# Patient Record
Sex: Female | Born: 1935 | Race: White | Hispanic: No
Health system: Southern US, Community
[De-identification: ages and names within clinical notes are randomized; demographics above are authoritative.]

---

## 2013-01-26 ENCOUNTER — Other Ambulatory Visit: Payer: Self-pay | Admitting: Emergency Medicine

## 2013-08-25 ENCOUNTER — Other Ambulatory Visit: Payer: Self-pay

## 2013-09-04 DIAGNOSIS — F329 Major depressive disorder, single episode, unspecified: Secondary | ICD-10-CM

## 2013-09-04 DIAGNOSIS — F0150 Vascular dementia without behavioral disturbance: Secondary | ICD-10-CM

## 2013-09-04 DIAGNOSIS — E119 Type 2 diabetes mellitus without complications: Secondary | ICD-10-CM

## 2013-09-04 DIAGNOSIS — F3289 Other specified depressive episodes: Secondary | ICD-10-CM

## 2013-09-04 DIAGNOSIS — J189 Pneumonia, unspecified organism: Secondary | ICD-10-CM

## 2013-09-11 ENCOUNTER — Other Ambulatory Visit: Payer: Self-pay

## 2013-09-11 ENCOUNTER — Telehealth: Payer: Self-pay | Admitting: Family Medicine

## 2013-09-11 ENCOUNTER — Other Ambulatory Visit: Payer: Self-pay | Admitting: Emergency Medicine

## 2013-09-28 NOTE — Telephone Encounter (Signed)
[facility] Triage Call Report Triage Record Num: [id number] Operator: Dustin FlockBobbie Jo Dobson-Trail Patient Name: Geanie CooleyDoris Hagin Call Date & Time: Oct 24, 2013 4:27:13AM Patient Phone: [phone] PCP: Tillman Abideichard Letvak Patient Gender: Female PCP Fax : [phone] Patient DOB: 08/30/1912 [facility] [place] Reason for Call: Caller: Elizabeth/RN; PCP: Tillman AbideLetvak , Richard (Family Practice); CB#: [phone]; Call regarding Lungs have filled with fluid; Onset 08-28-14. Labored breating, 86% 02 sat 2L, 110/60, P 53, 99.8 oral. BS not checked this morning. BS 335. Skin pale and moist to touch. States pt will look at you but will thrash in bed. Emergent S&S identified per Diabetes Protocol: Respiratory Problems: new or worsening S&S that may indicate shock. Advised 911. [facility]. Protocol(s) Used: Diabetes: Respiratory Problems Recommended Outcome per Protocol: Activate EMS 911 Reason for Outcome: New or worsening signs and symptoms that may indicate shock Care Advice: ~ 0Feb 27, 2015 4:40:57AM Page 1 of 1 [email or web address]

## 2013-09-28 NOTE — Telephone Encounter (Signed)
[facility] Will follow progress there

## 2013-09-28 DEATH — deceased

## 2013-12-31 ENCOUNTER — Ambulatory Visit: Payer: Self-pay | Admitting: Internal Medicine

## 2014-01-17 IMAGING — CT CT CERVICAL SPINE WITHOUT CONTRAST
1 series · 12 of 14 positions shown, 15 images · non-contrast
Comparison: none

REASON FOR EXAM: fell out of wheel chair
COMMENTS:

[Series 6: axial · axial · 0.30mm/px · z∈[-192,-75]mm · 12 of 74 slices shown, 15 images]
[im 6/74  soft-tissue]
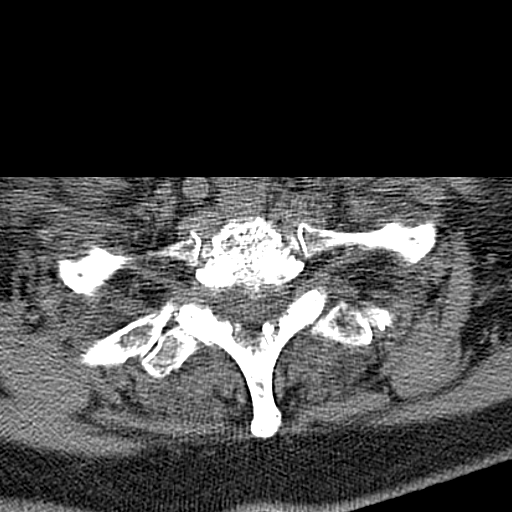
[im 6/74  bone]
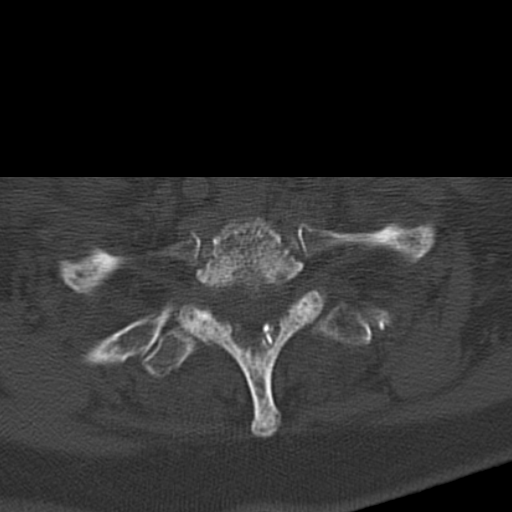
[im 12/74  bone]
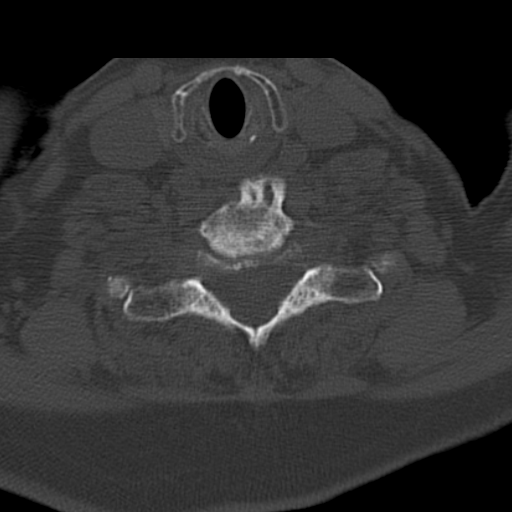
[im 17/74  bone]
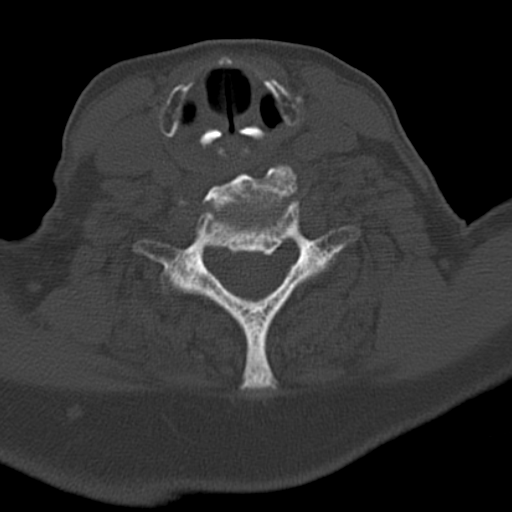
[im 23/74  bone]
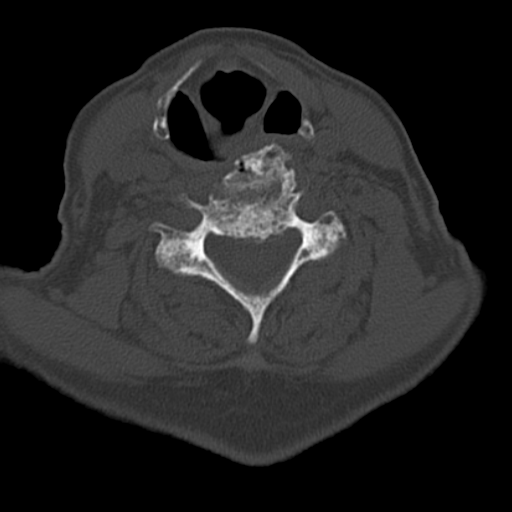
[im 29/74  soft-tissue]
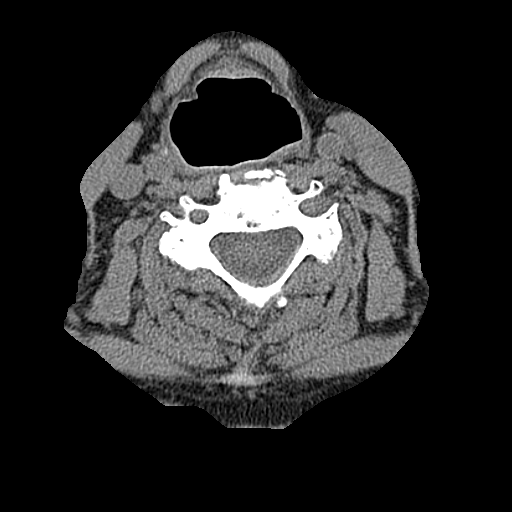
[im 29/74  bone]
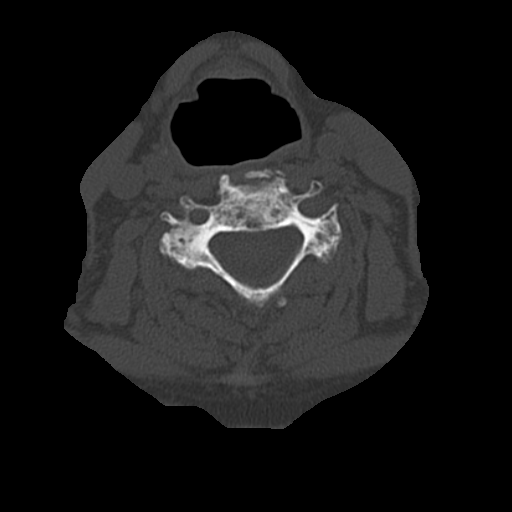
[im 34/74  bone]
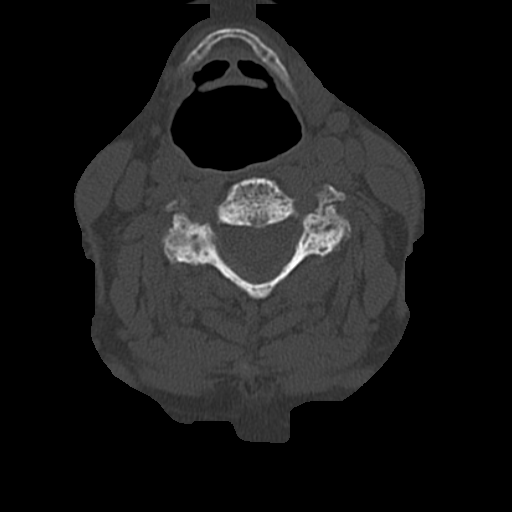
[im 40/74  bone]
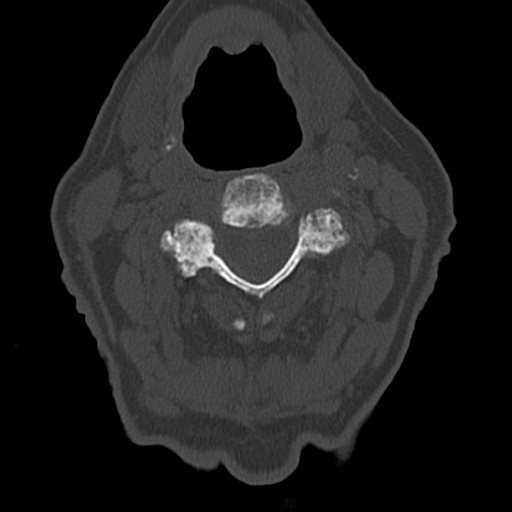
[im 45/74  bone]
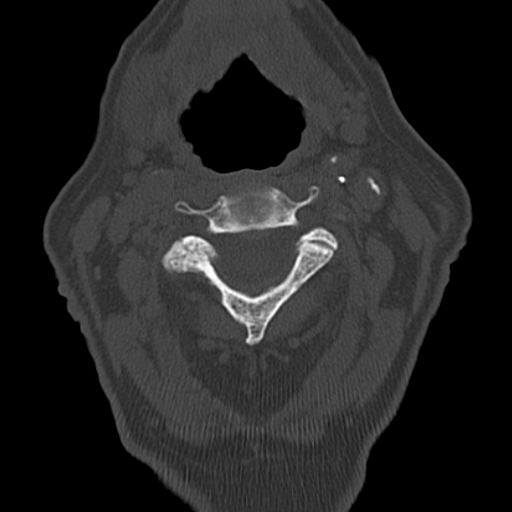
[im 51/74  soft-tissue]
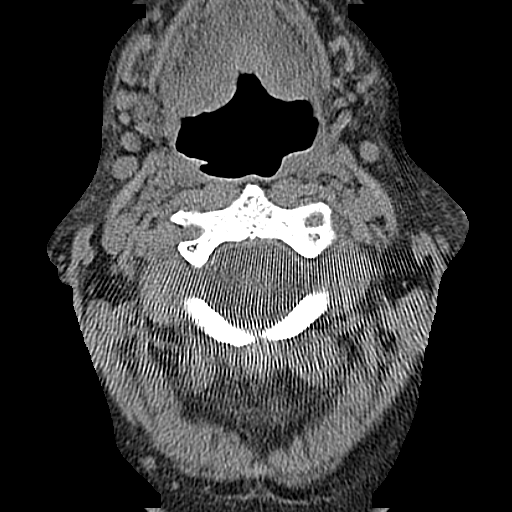
[im 51/74  bone]
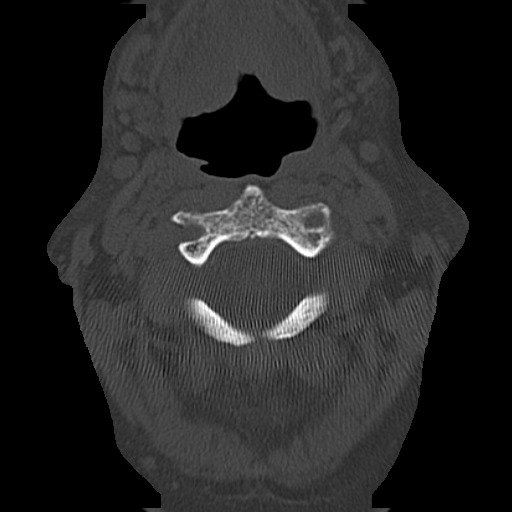
[im 57/74  bone]
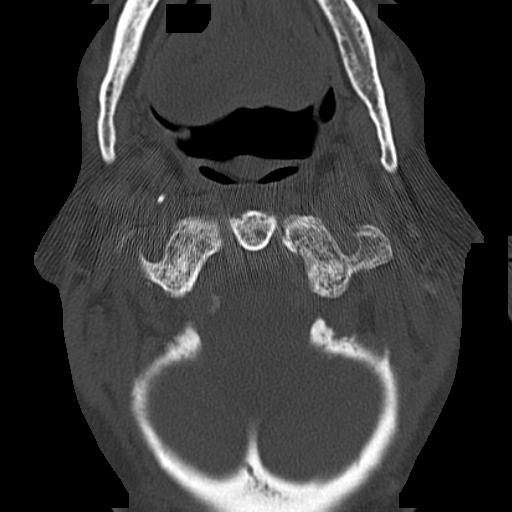
[im 62/74  bone]
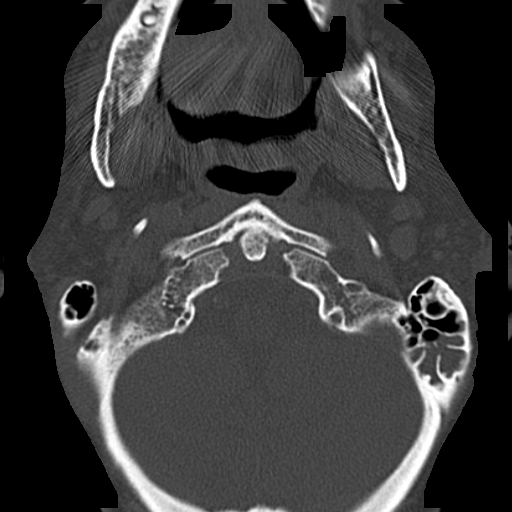
[im 68/74  bone]
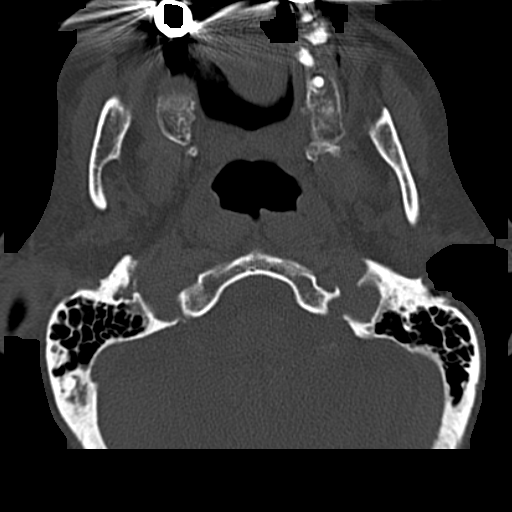

[12 of 14 positions shown; findings below may reference images not displayed]

PROCEDURE:     CT  - CT CERVICAL SPINE WO  - January 26, 2013  [DATE]

RESULT:     The emergent noncontrast multislice helical acquisition through
the cervical spine demonstrates prominent degenerative disc narrowing at
C5-C6 and C6-C7 with prominent hypertrophic endplate spurring. Spinal
alignment is maintained. Prevertebral soft tissues are normal. The
craniocervical junction and atlantoaxial alignment appear to be maintained.
There is congenital absence of fusion in the posterior midline arch of C1.
Multilevel facet arthropathy is present. There is a heterogeneous
attenuation within the bony structures.
IMPRESSION: 1. No acute bony abnormality evident. Chronic changes of the cervical spine.
Carotid atherosclerotic calcification is noted.

[REDACTED]

## 2014-09-02 IMAGING — CR DG CHEST 1V PORT
1 series · 1 of 1 positions shown · non-contrast
Comparison: 08/26/2013

CLINICAL DATA: Shortness of breath

EXAM:
PORTABLE CHEST - 1 VIEW

[ap]
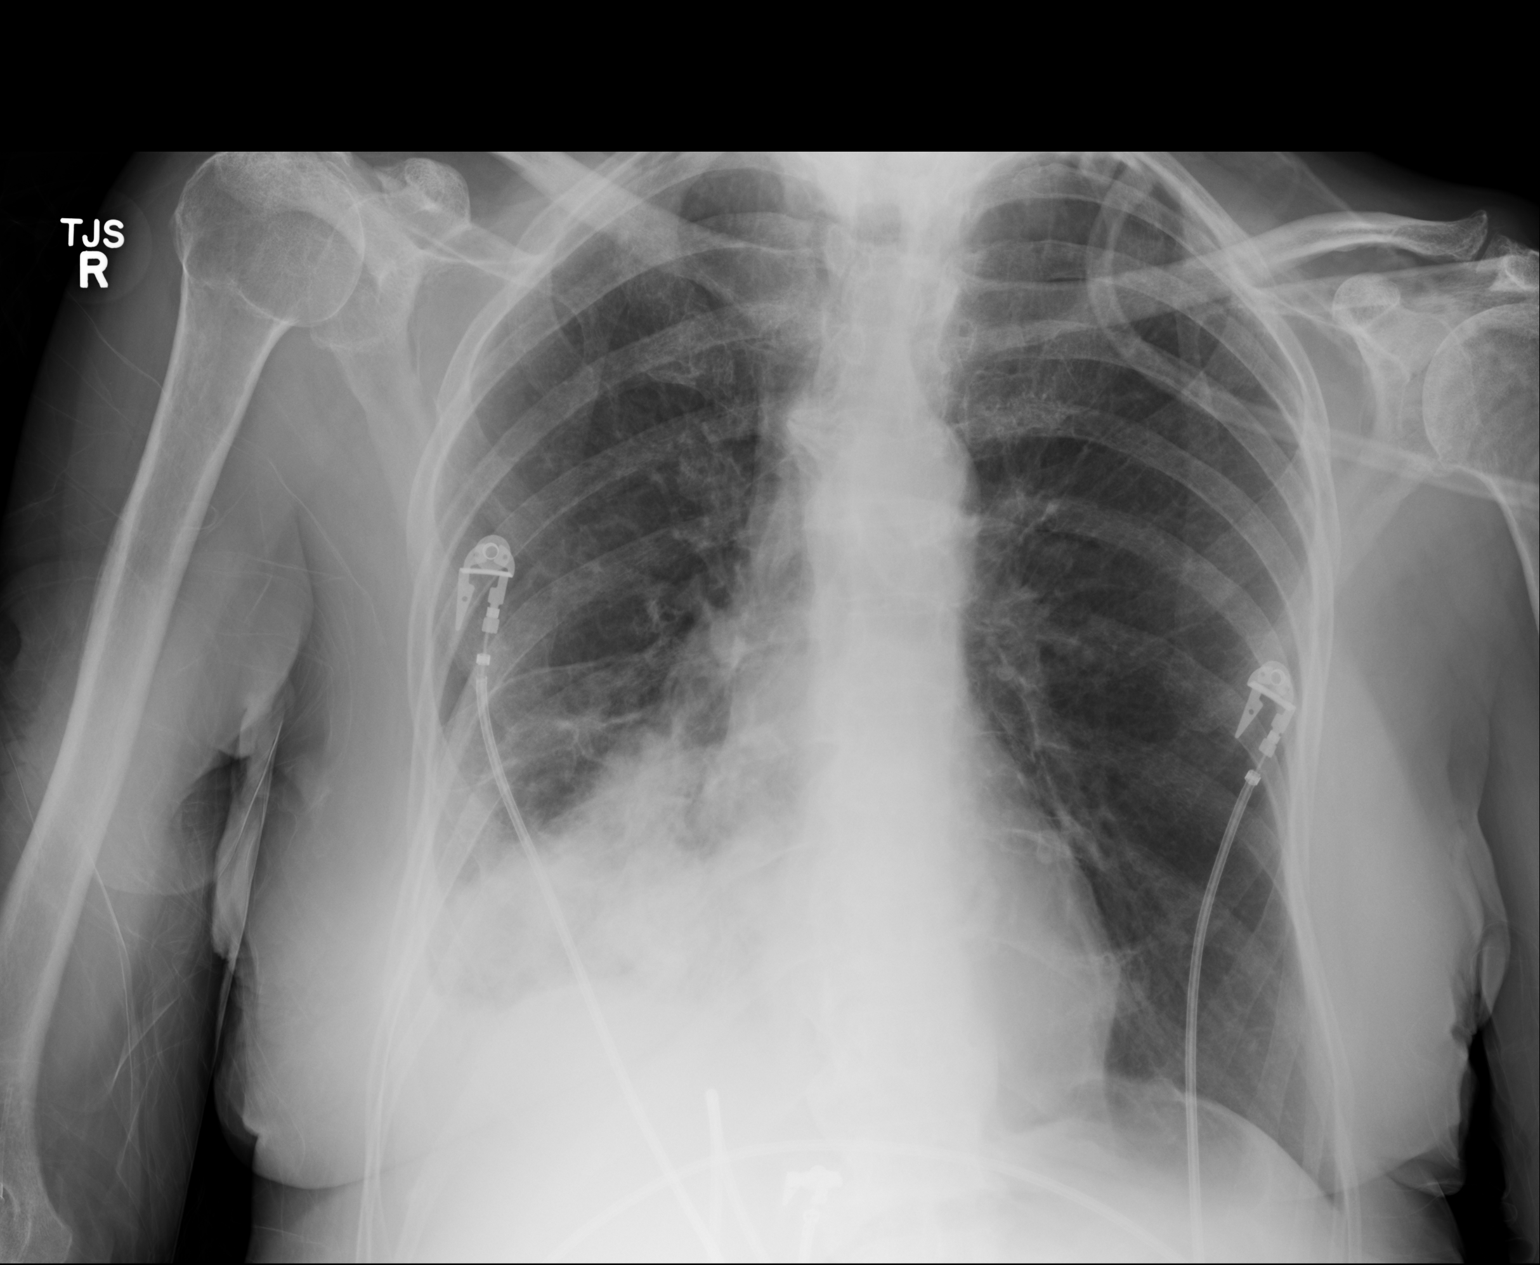

[1 of 1 positions shown; findings below may reference images not displayed]

FINDINGS: There is a dense right basilar opacity which is new from prior.
Small right pleural effusion. No cardiomegaly. Left lung relatively
clear.
IMPRESSION: Right lower lobe pneumonia with small effusion.
